# Patient Record
Sex: Male | Born: 1995 | Race: White | Hispanic: No | Marital: Single | Smoking: Never smoker
Health system: Southern US, Community
[De-identification: ages and names within clinical notes are randomized; demographics above are authoritative.]

## PROBLEM LIST (undated history)

## (undated) DIAGNOSIS — Q02 Microcephaly: Secondary | ICD-10-CM

## (undated) DIAGNOSIS — G808 Other cerebral palsy: Secondary | ICD-10-CM

## (undated) DIAGNOSIS — J45909 Unspecified asthma, uncomplicated: Secondary | ICD-10-CM

## (undated) DIAGNOSIS — K219 Gastro-esophageal reflux disease without esophagitis: Secondary | ICD-10-CM

## (undated) DIAGNOSIS — F913 Oppositional defiant disorder: Secondary | ICD-10-CM

## (undated) DIAGNOSIS — F70 Mild intellectual disabilities: Secondary | ICD-10-CM

## (undated) DIAGNOSIS — H5010 Unspecified exotropia: Secondary | ICD-10-CM

## (undated) DIAGNOSIS — R471 Dysarthria and anarthria: Secondary | ICD-10-CM

## (undated) DIAGNOSIS — Z8489 Family history of other specified conditions: Secondary | ICD-10-CM

## (undated) HISTORY — PX: STRABISMUS SURGERY: SHX218

## (undated) HISTORY — PX: INGUINAL HERNIA REPAIR: SHX194

---
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
## 2016-06-03 ENCOUNTER — Encounter (HOSPITAL_COMMUNITY): Payer: Self-pay | Admitting: Family Medicine

## 2016-06-03 ENCOUNTER — Ambulatory Visit (HOSPITAL_COMMUNITY)
Admission: EM | Admit: 2016-06-03 | Discharge: 2016-06-03 | Disposition: A | Discharge status: Home / Self Care | Payer: Medicaid Other | Attending: Family Medicine | Admitting: Family Medicine

## 2016-06-03 DIAGNOSIS — J069 Acute upper respiratory infection, unspecified: Secondary | ICD-10-CM

## 2016-06-03 MED ORDER — MONTELUKAST SODIUM 10 MG PO TABS: 10.0000 mg | ORAL_TABLET | Freq: Every day | ORAL | 0 refills | Status: AC

## 2016-06-03 NOTE — ED Triage Notes (Signed)
Onset Friday of symptoms Friday of headache and cough.  Has a runny nose, non-productive cough.  Seen by provider prior to this nurse

## 2016-06-03 NOTE — ED Provider Notes (Signed)
[facility]    CSN: [id number] Arrival date & time: 06/03/16  1005     History   Chief Complaint Chief Complaint  Patient presents with  . URI    HPI Luis Armstrong is a 20 y.o. male.   This is a 20 year old man who comes in complaining of 3 days of muscle aches behind his legs and epistaxis. He's had a cough cough for a little longer than 3 days. He's had no fever.  Patient's also had no nausea or vomiting.  Patient has a history of spastic diplegia and microcephaly.      Past Medical History:  Diagnosis Date  . Acid reflux    no current med.  . Asthma    prn inhaler  . Congenital diplegia (HCC)   . Dysarthria   . 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 Family history of adverse reaction to anesthesia    maternal grandmother has hx. of being hard to wake up post-op  . Microcephaly (HCC)   . Mild intellectual disability   . ODD (oppositional defiant disorder)     Patient Active Problem List   Diagnosis Date Noted  . Mild intellectual disabilities 02/16/2014  . Congenital diplegia (HCC) 02/16/2014  . Microcephalus (HCC) 10/06/2013  . Oppositional defiant disorder 10/06/2013  . Dysarthria 10/06/2013    Past Surgical History:  Procedure Laterality Date  . INGUINAL HERNIA REPAIR Bilateral   . STRABISMUS SURGERY    . STRABISMUS SURGERY Bilateral 07/07/2015   Procedure: REPAIR STRABISMUS BOTH EYES;  Surgeon: Verne CarrowWilliam Young, MD;  Location: [facility];  Service: Ophthalmology;  Laterality: Bilateral;       Home Medications    Prior to Admission medications   Medication Sig Start Date End Date Taking? Authorizing Provider  albuterol (PROVENTIL HFA;VENTOLIN HFA) 108 (90 Base) MCG/ACT inhaler Inhale into the lungs every 6 (six) hours as needed for wheezing or shortness of breath.    Historical Provider, MD  cetirizine (ZYRTEC) 10 MG tablet Take 10 mg by mouth daily.    Historical Provider, MD  montelukast (SINGULAIR) 10 MG tablet Take  1 tablet (10 mg total) by mouth at bedtime. 06/03/16   Elvina SidleKurt Yannick Steuber, MD    Family History Family History  Problem Relation Age of Onset  . Kidney failure Maternal Grandfather     Died at 4180  . Heart disease Maternal Grandmother     CABG  . Anesthesia problems Maternal Grandmother     hard to wake up post-op    Social History Social History  Substance Use Topics  . Smoking status: Never Smoker  . Smokeless tobacco: Former NeurosurgeonUser    Types: Chew  . Alcohol use No     Allergies   Patient has no known allergies.   Review of Systems Review of Systems  Constitutional: Negative.   HENT: Positive for nosebleeds.   Respiratory: Positive for cough.   Musculoskeletal: Positive for myalgias.  Neurological: Negative.      Physical Exam Triage Vital Signs ED Triage Vitals [06/03/16 1024]  Enc Vitals Group     BP 128/91     Pulse Rate 87     Resp      Temp 98.6 F (37 C)     Temp Source Oral     SpO2 96 %     Weight      Height      Head Circumference      Peak Flow      Pain  Score      Pain Loc      Pain Edu?      Excl. in GC?    No data found.   Updated Vital Signs BP 128/91 (BP Location: Left Arm)   Pulse 87   Temp 98.6 F (37 C) (Oral)   SpO2 96%    Physical Exam  Constitutional: He is oriented to person, place, and time. He appears well-developed and well-nourished.  HENT:  Right Ear: External ear normal.  Left Ear: External ear normal.  Mouth/Throat: Oropharynx is clear and moist.  There are small clots on each nasal septum.  Eyes: Conjunctivae are normal.  Neck: Normal range of motion. Neck supple.  Cardiovascular: Normal rate, regular rhythm and normal heart sounds.   Pulmonary/Chest: Effort normal and breath sounds normal.  Musculoskeletal: Normal range of motion.  Patient has low muscle mass and very thin fingers.  Neurological: He is alert and oriented to person, place, and time.  Skin: Skin is warm and dry.  Nursing note and vitals  reviewed.    UC Treatments / Results  Labs (all labs ordered are listed, but only abnormal results are displayed) Labs Reviewed - No data to display  EKG  EKG Interpretation None       Radiology No results found.  Procedures Procedures (including critical care time)  Medications Ordered in UC Medications - No data to display   Initial Impression / Assessment and Plan / UC Course  I have reviewed the triage vital signs and the nursing notes.  Pertinent labs & imaging results that were available during my care of the patient were reviewed by me and considered in my medical decision making (see chart for details).  Clinical Course     Final Clinical Impressions(s) / UC Diagnoses   Final diagnoses:  Acute upper respiratory infection    New Prescriptions New Prescriptions   MONTELUKAST (SINGULAIR) 10 MG TABLET    Take 1 tablet (10 mg total) by mouth at bedtime.     Elvina SidleKurt Burney Calzadilla, MD 06/03/16 1038

## 2016-06-03 NOTE — Discharge Instructions (Signed)
Use Afrin nasal spray twice daily for several days to stop the nosebleeds.   Ibuprofen should help with the muscle soreness.

## 2016-07-08 ENCOUNTER — Encounter (INDEPENDENT_AMBULATORY_CARE_PROVIDER_SITE_OTHER): Payer: Self-pay | Admitting: *Deleted

## 2017-03-22 ENCOUNTER — Emergency Department (HOSPITAL_BASED_OUTPATIENT_CLINIC_OR_DEPARTMENT_OTHER): Payer: Medicare Other

## 2017-03-22 ENCOUNTER — Emergency Department (HOSPITAL_BASED_OUTPATIENT_CLINIC_OR_DEPARTMENT_OTHER)
Admission: EM | Admit: 2017-03-22 | Discharge: 2017-03-22 | Disposition: A | Discharge status: Home / Self Care | Payer: Medicare Other | Attending: Emergency Medicine | Admitting: Emergency Medicine

## 2017-03-22 ENCOUNTER — Encounter (HOSPITAL_BASED_OUTPATIENT_CLINIC_OR_DEPARTMENT_OTHER): Payer: Self-pay | Admitting: Emergency Medicine

## 2017-03-22 DIAGNOSIS — J069 Acute upper respiratory infection, unspecified: Secondary | ICD-10-CM | POA: Insufficient documentation

## 2017-03-22 DIAGNOSIS — J45909 Unspecified asthma, uncomplicated: Secondary | ICD-10-CM | POA: Insufficient documentation

## 2017-03-22 DIAGNOSIS — R040 Epistaxis: Secondary | ICD-10-CM

## 2017-03-22 DIAGNOSIS — Z79899 Other long term (current) drug therapy: Secondary | ICD-10-CM | POA: Diagnosis not present

## 2017-03-22 DIAGNOSIS — B9789 Other viral agents as the cause of diseases classified elsewhere: Secondary | ICD-10-CM | POA: Insufficient documentation

## 2017-03-22 MED ORDER — OXYMETAZOLINE HCL 0.05 % NA SOLN
1.0000 | Freq: Once | NASAL | Status: AC
  Administered 2017-03-22: 1 via NASAL
  Filled 2017-03-22: qty 15

## 2017-03-22 NOTE — ED Provider Notes (Signed)
[facility]   CSN: [id number] Arrival date & time: 03/22/17  1015     History   Chief Complaint Chief Complaint  Patient presents with  . Epistaxis    HPI Luis Armstrong is a 21 y.o. male.  Patient is a 21 year old male with a history of acid reflux and microcephaly with an intellectual disability who lives in a group home. He presents with a nosebleed. The family member who is with him states that he hasn't been feeling well since yesterday. He's had a little bit of nasal congestion and achiness. They haven't noted any fevers. He's had some cough. This morning he had a nosebleed which resolved spontaneously. He's been complaining of some mild headaches but currently denies any headache. He had an episode of vomiting yesterday but no further episodes of vomiting. He hasn't taken anything today for the symptoms.      Past Medical History:  Diagnosis Date  . Acid reflux    no current med.  . Asthma    prn inhaler  . Congenital diplegia (HCC)   . Dysarthria   . 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 Family history of adverse reaction to anesthesia    maternal grandmother has hx. of being hard to wake up post-op  . Microcephaly (HCC)   . Mild intellectual disability   . ODD (oppositional defiant disorder)     Patient Active Problem List   Diagnosis Date Noted  . Mild intellectual disabilities 02/16/2014  . Congenital diplegia (HCC) 02/16/2014  . Microcephalus (HCC) 10/06/2013  . Oppositional defiant disorder 10/06/2013  . Dysarthria 10/06/2013    Past Surgical History:  Procedure Laterality Date  . INGUINAL HERNIA REPAIR Bilateral   . STRABISMUS SURGERY    . STRABISMUS SURGERY Bilateral 07/07/2015   Procedure: REPAIR STRABISMUS BOTH EYES;  Surgeon: Verne Carrow, MD;  Location: [facility];  Service: Ophthalmology;  Laterality: Bilateral;       Home Medications    Prior to Admission medications   Medication Sig Start  Date End Date Taking? Authorizing Provider  albuterol (PROVENTIL HFA;VENTOLIN HFA) 108 (90 Base) MCG/ACT inhaler Inhale into the lungs every 6 (six) hours as needed for wheezing or shortness of breath.    [provider]  cetirizine (ZYRTEC) 10 MG tablet Take 10 mg by mouth daily.    [provider]  montelukast (SINGULAIR) 10 MG tablet Take 1 tablet (10 mg total) by mouth at bedtime. 06/03/16   Elvina Sidle, MD    Family History Family History  Problem Relation Age of Onset  . Kidney failure Maternal Grandfather        Died at 37  . Heart disease Maternal Grandmother        CABG  . Anesthesia problems Maternal Grandmother        hard to wake up post-op    Social History Social History  Substance Use Topics  . Smoking status: Never Smoker  . Smokeless tobacco: Former Neurosurgeon    Types: Chew  . Alcohol use No     Allergies   Patient has no known allergies.   Review of Systems Review of Systems  Constitutional: Positive for fatigue. Negative for chills, diaphoresis and fever.  HENT: Positive for congestion, nosebleeds and rhinorrhea. Negative for sneezing.   Eyes: Negative.   Respiratory: Positive for cough. Negative for chest tightness and shortness of breath.   Cardiovascular: Negative for chest pain and leg swelling.  Gastrointestinal: Negative for abdominal pain,  blood in stool, diarrhea, nausea and vomiting.  Genitourinary: Negative for difficulty urinating, flank pain, frequency and hematuria.  Musculoskeletal: Positive for myalgias. Negative for arthralgias and back pain.  Skin: Negative for rash.  Neurological: Positive for headaches. Negative for dizziness, speech difficulty, weakness and numbness.     Physical Exam Updated Vital Signs BP 118/89 (BP Location: Right Arm)   Pulse 94   Temp 98.6 F (37 C) (Oral)   Resp 18   Ht  (1.702 m)   Wt 56.7 kg (125 lb)   SpO2 100%   BMI 19.58 kg/m   Physical Exam  Constitutional: He is  oriented to person, place, and time. He appears well-developed and well-nourished.  HENT:  Head: Normocephalic and atraumatic.  Right Ear: External ear normal.  Left Ear: External ear normal.  Dried blood in the right nare.  No active bleeding.  No blood in the posterior pharynx  Eyes: Pupils are equal, round, and reactive to light.  Neck: Normal range of motion. Neck supple.  No meningismus  Cardiovascular: Normal rate, regular rhythm and normal heart sounds.   Pulmonary/Chest: Effort normal and breath sounds normal. No respiratory distress. He has no wheezes. He has no rales. He exhibits no tenderness.  Abdominal: Soft. Bowel sounds are normal. There is no tenderness. There is no rebound and no guarding.  Musculoskeletal: Normal range of motion. He exhibits no edema.  Lymphadenopathy:    He has no cervical adenopathy.  Neurological: He is alert and oriented to person, place, and time.  Skin: Skin is warm and dry. No rash noted.  Psychiatric: He has a normal mood and affect.     ED Treatments / Results  Labs (all labs ordered are listed, but only abnormal results are displayed) Labs Reviewed - No data to display  EKG  EKG Interpretation None       Radiology Dg Chest 2 View  Result Date: 03/22/2017 CLINICAL DATA:  Acute cough today. EXAM: CHEST  2 VIEW COMPARISON:  None. FINDINGS: The cardiomediastinal silhouette is unremarkable. There is no evidence of focal airspace disease, pulmonary edema, suspicious pulmonary nodule/mass, pleural effusion, or pneumothorax. No acute bony abnormalities are identified. IMPRESSION: No active cardiopulmonary disease. Electronically Signed   By: Harmon Pier M.D.   On: 03/22/2017 11:30    Procedures Procedures (including critical care time)  Medications Ordered in ED Medications  oxymetazoline (AFRIN) 0.05 % nasal spray 1 spray (not administered)     Initial Impression / Assessment and Plan / ED Course  I have reviewed the triage vital  signs and the nursing notes.  Pertinent labs & imaging results that were available during my care of the patient were reviewed by me and considered in my medical decision making (see chart for details).     Patient presents with a nosebleed. I feel that this is most likely related to URI symptoms. He has congestion myalgias and fatigue consistent with a URI.  Chest x-ray does not reveal any evidence of pneumonia. He's otherwise well-appearing. He has no hypoxia. No tachycardia. No abdominal pain on exam. No active nosebleed. He was given Afrin nasal spray to use. He was otherwise advised in symptomatic care. Return precautions were given.  Final Clinical Impressions(s) / ED Diagnoses   Final diagnoses:  Viral upper respiratory tract infection  Epistaxis    New Prescriptions New Prescriptions   No medications on file     Rolan Bucco, MD 03/22/17 1136

## 2017-03-22 NOTE — ED Notes (Signed)
Pt given cheese slices and water to drink.

## 2017-03-22 NOTE — ED Triage Notes (Signed)
Pt is from a group home. Caregiver advises pt c/o body aches and headache yesterday and nose bleed this morning.

## 2017-08-15 ENCOUNTER — Other Ambulatory Visit: Payer: Self-pay | Admitting: Family Medicine

## 2017-08-15 DIAGNOSIS — M2603 Mandibular hyperplasia: Secondary | ICD-10-CM

## 2017-08-15 DIAGNOSIS — M2602 Maxillary hypoplasia: Secondary | ICD-10-CM

## 2017-09-12 ENCOUNTER — Ambulatory Visit
Admission: RE | Admit: 2017-09-12 | Discharge: 2017-09-12 | Disposition: A | Discharge status: Home / Self Care | Payer: Medicare Other | Source: Ambulatory Visit | Attending: Family Medicine | Admitting: Family Medicine

## 2017-09-12 DIAGNOSIS — M2603 Mandibular hyperplasia: Secondary | ICD-10-CM

## 2017-09-12 DIAGNOSIS — M2602 Maxillary hypoplasia: Secondary | ICD-10-CM

## 2018-03-26 ENCOUNTER — Ambulatory Visit (INDEPENDENT_AMBULATORY_CARE_PROVIDER_SITE_OTHER): Payer: Medicare Other | Admitting: Pediatrics

## 2018-03-26 ENCOUNTER — Encounter (INDEPENDENT_AMBULATORY_CARE_PROVIDER_SITE_OTHER): Payer: Self-pay | Admitting: Pediatrics

## 2018-03-26 VITALS — BP 120/80 | HR 84 | Ht 69.75 in | Wt 120.4 lb

## 2018-03-26 DIAGNOSIS — F70 Mild intellectual disabilities: Secondary | ICD-10-CM | POA: Diagnosis not present

## 2018-03-26 DIAGNOSIS — Q02 Microcephaly: Secondary | ICD-10-CM

## 2018-03-26 DIAGNOSIS — G808 Other cerebral palsy: Secondary | ICD-10-CM | POA: Diagnosis not present

## 2018-03-26 DIAGNOSIS — H53002 Unspecified amblyopia, left eye: Secondary | ICD-10-CM | POA: Diagnosis not present

## 2018-03-26 NOTE — Patient Instructions (Addendum)
I am pleased that Luis Armstrong is doing well and that he is safely in a residential group home.  We are available to provide neurologic care for him as needed.  Thank you for bringing him today.
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
## 2018-11-13 IMAGING — CT CT MAXILLOFACIAL W/O CM
1 series · 15 of 30 positions shown, 19 images · non-contrast
Comparison: None.

CLINICAL DATA: Preoperative CT for mandible reconstruction. Painful
under bite. Maxillary hypoplasia. Hyperplasia of the mandible.

EXAM:
CT MAXILLOFACIAL WITHOUT CONTRAST
TECHNIQUE: Multidetector CT imaging of the maxillofacial structures was
performed. Multiplanar CT image reconstructions were also generated.

[Series 5: maxofacial soft · axial · 0.37mm/px · z∈[-228,-66]mm · 15 of 58 slices shown, 19 images]
[im 2/58  brain]
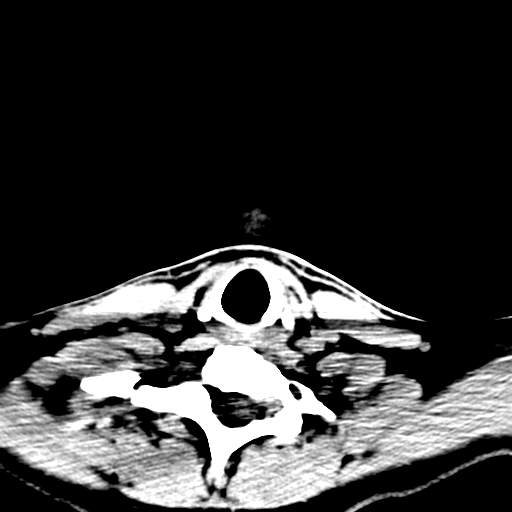
[im 2/58  bone]
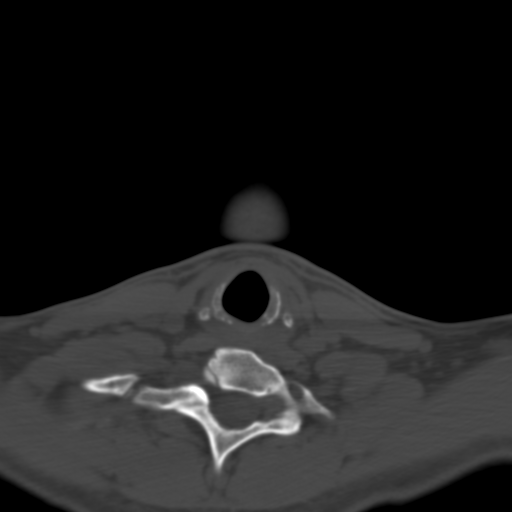
[im 6/58  bone]
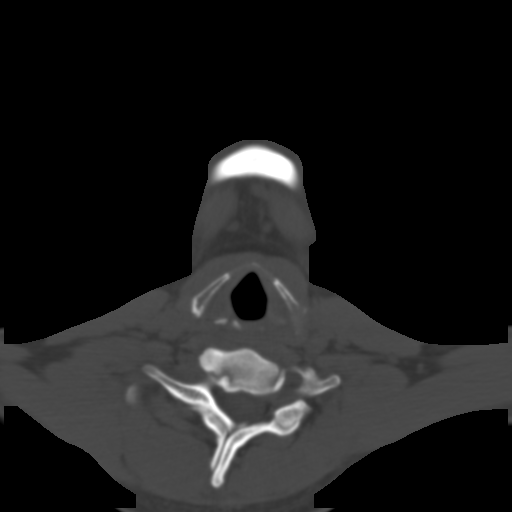
[im 10/58  bone]
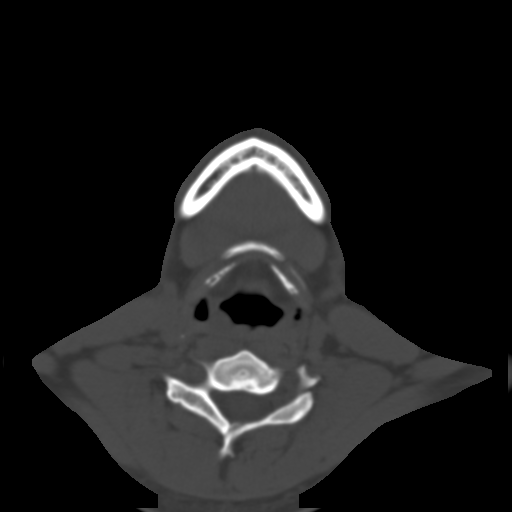
[im 14/58  bone]
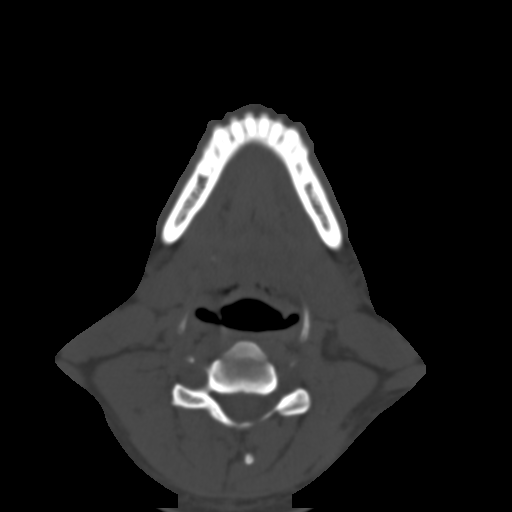
[im 18/58  brain]
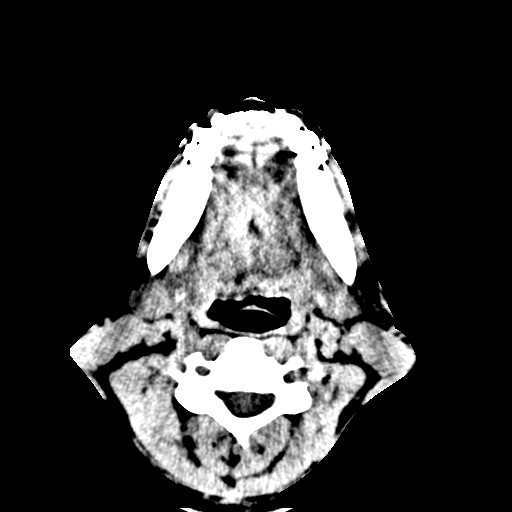
[im 18/58  bone]
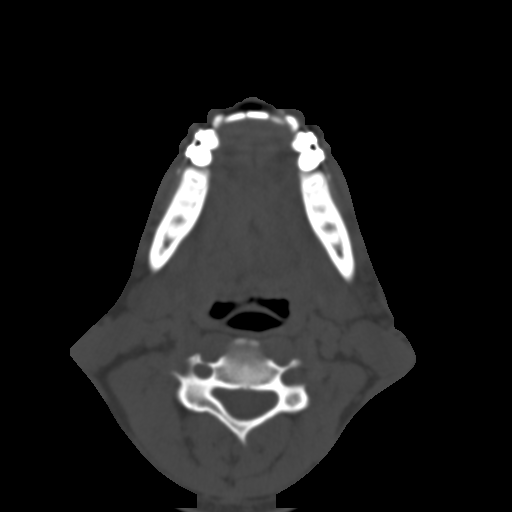
[im 22/58  bone]
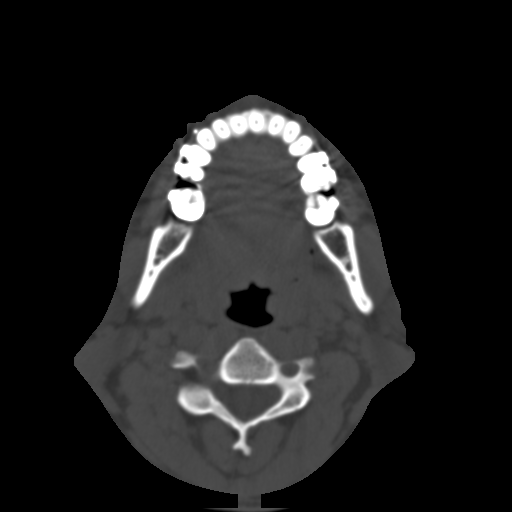
[im 26/58  bone]
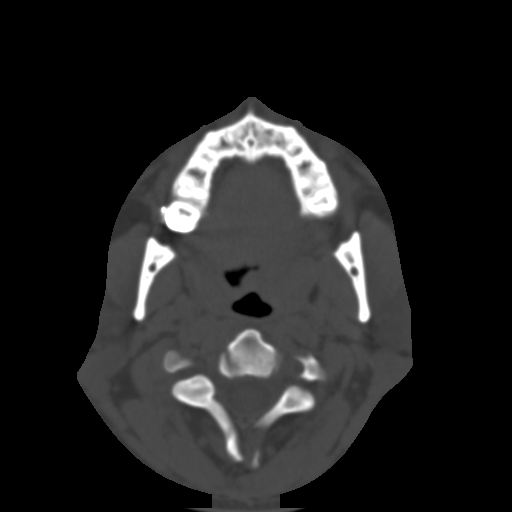
[im 30/58  bone]
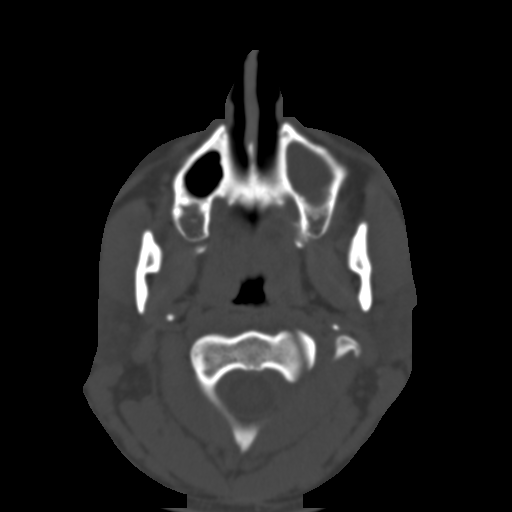
[im 32/58  brain]
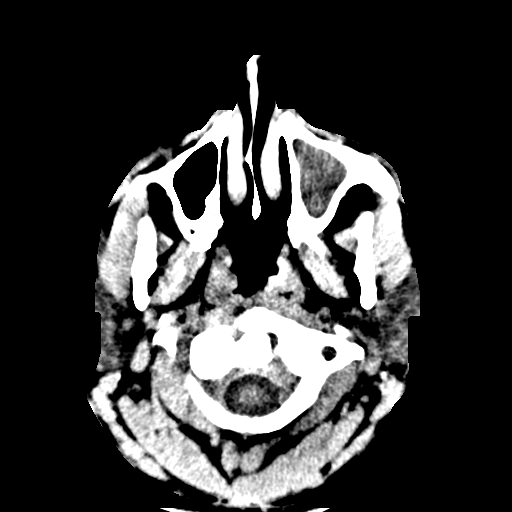
[im 32/58  bone]
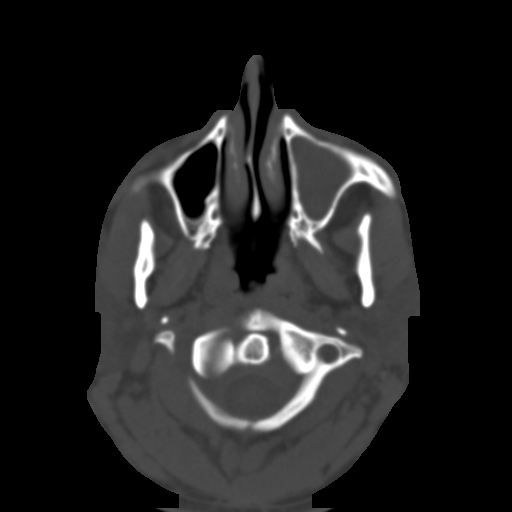
[im 36/58  bone]
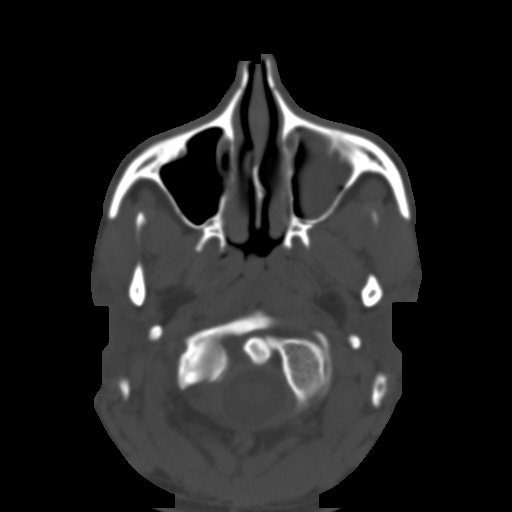
[im 40/58  bone]
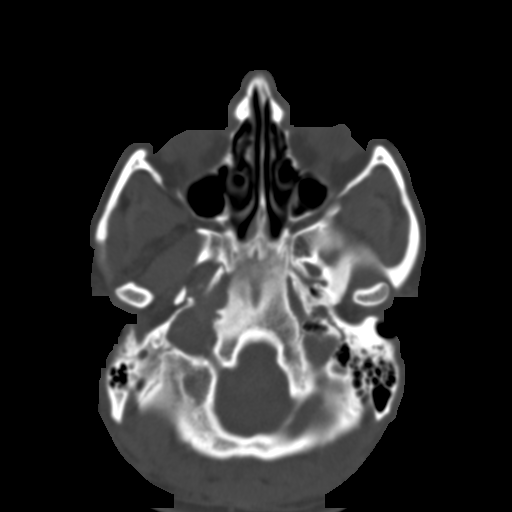
[im 44/58  bone]
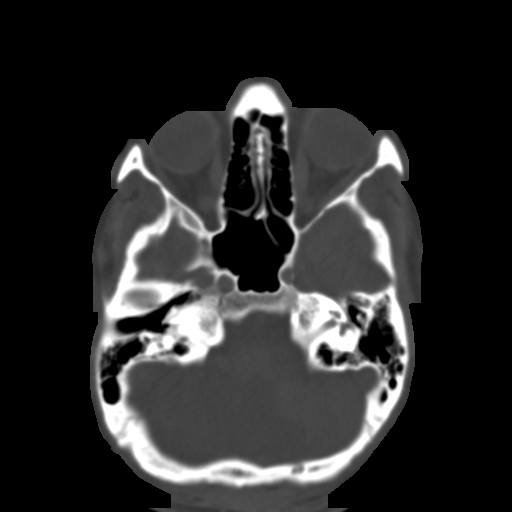
[im 48/58  brain]
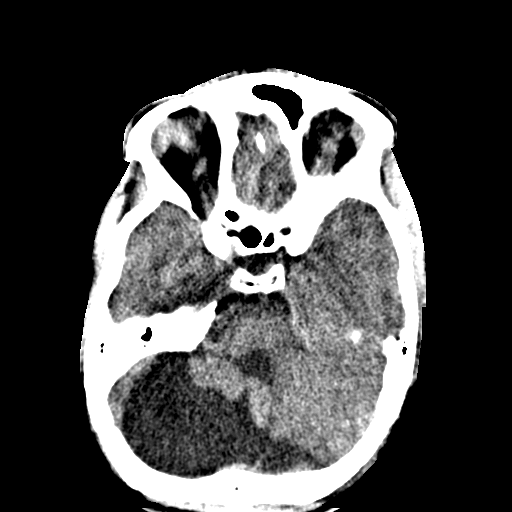
[im 48/58  bone]
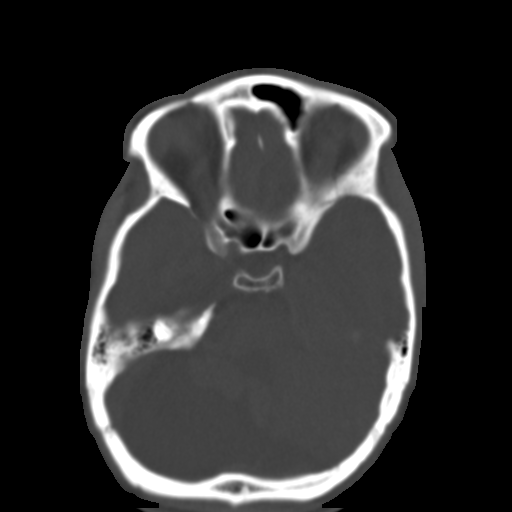
[im 52/58  bone]
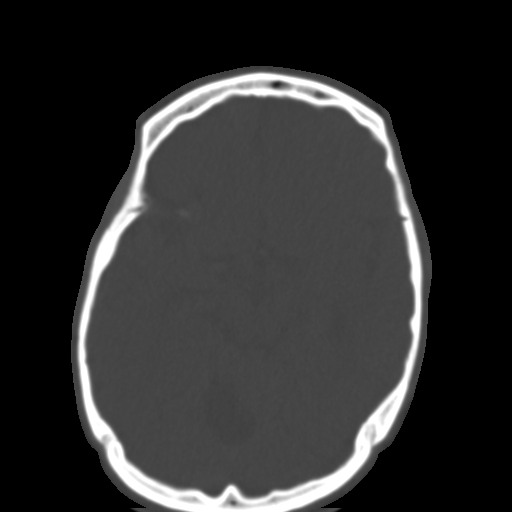
[im 56/58  bone]
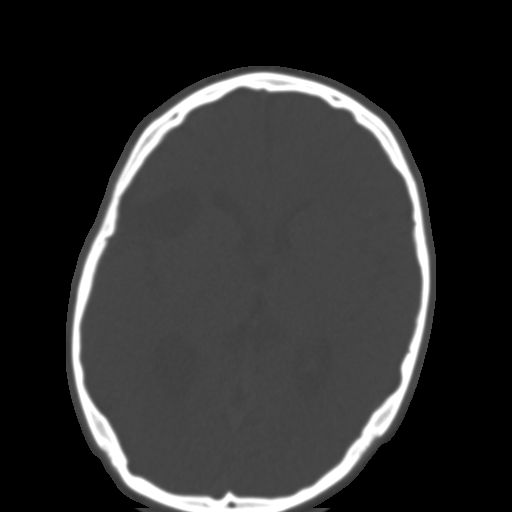

[15 of 30 positions shown; findings below may reference images not displayed]

FINDINGS: Osseous: No focal lytic or blastic lesions are present. There is no
acute or healing fracture. Mandible is intact and located. The
mandibular incisors are positioned anterior to the maxillary
incisors.

There is congenital fusion of the cervical spine at C5-6. The skull
base is unremarkable.

Orbits: Within normal limits.

Sinuses: Large polyp or mucous retention cyst opacifies most of the
left maxillary sinus. The remaining paranasal sinuses in the mastoid
air cells are clear.

Soft tissues: Soft tissues of the face are unremarkable.

Limited intracranial: There is marked hypoplasia of the right
cerebellar hemisphere. A cleft is noted along the anterior sylvian
fissure on the right. Asymmetric ex vacuo dilation is present in the
right lateral ventricle. No discrete mass lesion is present.
IMPRESSION: 1. Mandibular incisors are position anterior to the maxillary
incisors without discrete osseous lesion or mandibular abnormality.
2. Marked hypoplasia of the right cerebellar hemisphere.
3. Right frontal lobe cleft extending into the right lateral
ventricle with ex vacuo dilation of the right lateral ventricle.
4. MRI the brain without and with contrast could be used for further
evaluation of these regions if not previously performed.
5. Prominent polyp or mucous retention cyst within the left
maxillary sinus.

## 2019-04-16 ENCOUNTER — Other Ambulatory Visit: Payer: Self-pay

## 2019-04-16 DIAGNOSIS — Z20822 Contact with and (suspected) exposure to covid-19: Secondary | ICD-10-CM

## 2019-04-17 LAB — NOVEL CORONAVIRUS, NAA: SARS-CoV-2, NAA: NOT DETECTED

## 2020-06-22 ENCOUNTER — Other Ambulatory Visit: Payer: Medicare Other

## 2020-10-26 ENCOUNTER — Encounter (INDEPENDENT_AMBULATORY_CARE_PROVIDER_SITE_OTHER): Payer: Self-pay

## 2023-07-08 ENCOUNTER — Telehealth (INDEPENDENT_AMBULATORY_CARE_PROVIDER_SITE_OTHER): Payer: Self-pay | Admitting: Otolaryngology

## 2023-07-08 NOTE — Telephone Encounter (Signed)
 Atrovent 0.06% nasal spray was called in at [facility] in [place], [place] with 5 refill.

## 2023-12-09 ENCOUNTER — Ambulatory Visit (INDEPENDENT_AMBULATORY_CARE_PROVIDER_SITE_OTHER): Payer: Medicare Other | Admitting: Otolaryngology

## 2023-12-09 ENCOUNTER — Encounter (INDEPENDENT_AMBULATORY_CARE_PROVIDER_SITE_OTHER): Payer: Self-pay | Admitting: Otolaryngology

## 2023-12-09 VITALS — BP 127/84 | HR 76 | Wt 114.0 lb

## 2023-12-09 DIAGNOSIS — J343 Hypertrophy of nasal turbinates: Secondary | ICD-10-CM

## 2023-12-09 DIAGNOSIS — R0982 Postnasal drip: Secondary | ICD-10-CM

## 2023-12-09 DIAGNOSIS — J310 Chronic rhinitis: Secondary | ICD-10-CM | POA: Diagnosis not present

## 2023-12-09 DIAGNOSIS — R0981 Nasal congestion: Secondary | ICD-10-CM

## 2023-12-09 DIAGNOSIS — J342 Deviated nasal septum: Secondary | ICD-10-CM

## 2023-12-09 DIAGNOSIS — H6123 Impacted cerumen, bilateral: Secondary | ICD-10-CM

## 2023-12-09 NOTE — Progress Notes (Unsigned)
 Patient ID: Luis Armstrong, male   DOB: 09/11/1995, 28 y.o.   MRN: [id number]  Follow-up: Chronic rhinorrhea, postnasal drainage  HPI: The patient is a 28 year old male who returns today for his follow-up evaluation.  The patient was last seen 1 year ago.  At that time, he was complaining of chronic rhinorrhea for many years.  He was diagnosed with vasomotor rhinitis.  He was noted to have nasal mucosal congestion, nasal septal deviation, and bilateral inferior turbinate hypertrophy.  He was treated with Atrovent nasal spray.  The patient returns today reporting improvement in his nasal drainage.  He has occasional mild nasal congestion.  He denies any facial pain, fever, or visual change.  Exam: General: Communicates without difficulty, well nourished, no acute distress. Head: Normocephalic, no evidence injury, no tenderness, facial buttresses intact without stepoff. Face/sinus: No tenderness to palpation and percussion. Facial movement is normal and symmetric. Eyes: PERRL, EOMI. No scleral icterus, conjunctivae clear. Neuro: CN II exam reveals vision grossly intact.  No nystagmus at any point of gaze. Ears: Auricles well formed without lesions.  Bilateral cerumen impaction.  Nose: External evaluation reveals normal support and skin without lesions.  Dorsum is intact.  Anterior rhinoscopy reveals congested mucosa over anterior aspect of inferior turbinates and deviated septum.  No purulence noted. Oral:  Oral cavity and oropharynx are intact, symmetric, without erythema or edema.  Mucosa is moist without lesions. Neck: Full range of motion without pain.  There is no significant lymphadenopathy.  No masses palpable.  Thyroid bed within normal limits to palpation.  Parotid glands and submandibular glands equal bilaterally without mass.  Trachea is midline. Neuro:  CN 2-12 grossly intact.   Procedure: Bilateral cerumen disimpaction Anesthesia: None Description: Under the operating microscope, the  cerumen is carefully removed with a combination of cerumen currette, alligator forceps, and suction catheters.  After the cerumen is removed, the TMs are noted to be normal.  No mass, erythema, or lesions. The patient tolerated the procedure well.    Assessment: 1.  Chronic vasomotor rhinitis, with nasal mucosal congestion, nasal septal deviation, and bilateral inferior turbinate hypertrophy. 2.  The severity of the patient's postnasal drainage has improved with the use of Atrovent. 3.  Incidental finding of bilateral cerumen impaction.  Plan: 1.  The physical exam findings are reviewed with the patient. 2.  Continue with Atrovent nasal spray as needed. 3.  Otomicroscopy with bilateral cerumen disimpaction. 4.  The patient will return for reevaluation in 1 year, sooner if needed.

## 2023-12-11 DIAGNOSIS — H6123 Impacted cerumen, bilateral: Secondary | ICD-10-CM | POA: Insufficient documentation

## 2023-12-11 DIAGNOSIS — J343 Hypertrophy of nasal turbinates: Secondary | ICD-10-CM | POA: Insufficient documentation

## 2023-12-11 DIAGNOSIS — R0982 Postnasal drip: Secondary | ICD-10-CM | POA: Insufficient documentation

## 2023-12-11 DIAGNOSIS — J310 Chronic rhinitis: Secondary | ICD-10-CM | POA: Insufficient documentation

## 2023-12-11 DIAGNOSIS — J342 Deviated nasal septum: Secondary | ICD-10-CM | POA: Insufficient documentation

## 2024-06-04 ENCOUNTER — Telehealth (INDEPENDENT_AMBULATORY_CARE_PROVIDER_SITE_OTHER): Payer: Self-pay | Admitting: Otolaryngology

## 2024-06-04 NOTE — Telephone Encounter (Signed)
 Atrovent 0.06% nasal spray was faxed to [facility] in [place], with 5 refills.
